# Patient Record
Sex: Male | Born: 1970 | Race: White | Hispanic: No | Marital: Single | Smoking: Never smoker
Health system: Southern US, Community
[De-identification: ages and names within clinical notes are randomized; demographics above are authoritative.]

---

## 2013-03-13 ENCOUNTER — Emergency Department: Payer: Self-pay | Admitting: Emergency Medicine

## 2013-03-13 LAB — URINALYSIS, COMPLETE
Bacteria: NONE SEEN
Bilirubin,UR: NEGATIVE
Glucose,UR: 50 mg/dL (ref 0–75)
Leukocyte Esterase: NEGATIVE
Nitrite: NEGATIVE
Ph: 5 (ref 4.5–8.0)
RBC,UR: 3 /HPF (ref 0–5)
Specific Gravity: 1.028 (ref 1.003–1.030)
Squamous Epithelial: <1
WBC UR: 5 /HPF (ref 0–5)

## 2013-03-13 LAB — COMPREHENSIVE METABOLIC PANEL
Albumin: 4.3 g/dL (ref 3.4–5.0)
Bilirubin,Total: 0.5 mg/dL (ref 0.2–1.0)
Calcium, Total: 9.5 mg/dL (ref 8.5–10.1)
Chloride: 109 mmol/L — ABNORMAL HIGH (ref 98–107)
Co2: 27 mmol/L (ref 21–32)
Creatinine: 0.87 mg/dL (ref 0.60–1.30)
EGFR (African American): >60
EGFR (Non-African Amer.): >60
Glucose: 171 mg/dL — ABNORMAL HIGH (ref 65–99)
Osmolality: 286 (ref 275–301)
Potassium: 4 mmol/L (ref 3.5–5.1)
SGPT (ALT): 28 U/L (ref 12–78)
Total Protein: 7.3 g/dL (ref 6.4–8.2)

## 2013-03-13 LAB — CBC
HCT: 46.2 % (ref 40.0–52.0)
RDW: 14 % (ref 11.5–14.5)
WBC: 13.5 10*3/uL — ABNORMAL HIGH (ref 3.8–10.6)

## 2015-11-05 ENCOUNTER — Encounter: Payer: Self-pay | Admitting: *Deleted

## 2015-11-05 ENCOUNTER — Emergency Department
Admission: EM | Admit: 2015-11-05 | Discharge: 2015-11-05 | Disposition: A | Discharge status: Home / Self Care | Payer: Managed Care, Other (non HMO) | Attending: Emergency Medicine | Admitting: Emergency Medicine

## 2015-11-05 ENCOUNTER — Emergency Department: Payer: Managed Care, Other (non HMO)

## 2015-11-05 DIAGNOSIS — R1031 Right lower quadrant pain: Secondary | ICD-10-CM | POA: Diagnosis present

## 2015-11-05 DIAGNOSIS — N200 Calculus of kidney: Secondary | ICD-10-CM | POA: Diagnosis not present

## 2015-11-05 DIAGNOSIS — R109 Unspecified abdominal pain: Secondary | ICD-10-CM

## 2015-11-05 LAB — URINALYSIS COMPLETE WITH MICROSCOPIC (ARMC ONLY)
Bacteria, UA: NONE SEEN
Bilirubin Urine: NEGATIVE
Glucose, UA: NEGATIVE mg/dL
Leukocytes, UA: NEGATIVE
Nitrite: NEGATIVE
Protein, ur: NEGATIVE mg/dL
Specific Gravity, Urine: 1.012 (ref 1.005–1.030)
pH: 6 (ref 5.0–8.0)

## 2015-11-05 MED ORDER — ONDANSETRON HCL 4 MG/2ML IJ SOLN
4.0000 mg | Freq: Once | INTRAMUSCULAR | Status: AC
  Administered 2015-11-05: 4 mg via INTRAVENOUS
  Filled 2015-11-05: qty 2

## 2015-11-05 MED ORDER — ONDANSETRON HCL 4 MG PO TABS: 4.0000 mg | ORAL_TABLET | Freq: Three times a day (TID) | ORAL | 0 refills | Status: AC | PRN

## 2015-11-05 MED ORDER — SODIUM CHLORIDE 0.9 % IV BOLUS (SEPSIS)
500.0000 mL | Freq: Once | INTRAVENOUS | Status: AC
  Administered 2015-11-05: 500 mL via INTRAVENOUS

## 2015-11-05 MED ORDER — IBUPROFEN 400 MG PO TABS: 400.0000 mg | ORAL_TABLET | Freq: Four times a day (QID) | ORAL | 0 refills | Status: AC | PRN

## 2015-11-05 MED ORDER — HYDROMORPHONE HCL 1 MG/ML IJ SOLN
1.0000 mg | Freq: Once | INTRAMUSCULAR | Status: AC
  Administered 2015-11-05: 1 mg via INTRAVENOUS
  Filled 2015-11-05: qty 1

## 2015-11-05 MED ORDER — KETOROLAC TROMETHAMINE 30 MG/ML IJ SOLN
30.0000 mg | Freq: Once | INTRAMUSCULAR | Status: AC
  Administered 2015-11-05: 30 mg via INTRAVENOUS
  Filled 2015-11-05: qty 1

## 2015-11-05 MED ORDER — HYDROMORPHONE HCL 2 MG PO TABS: 2.0000 mg | ORAL_TABLET | Freq: Two times a day (BID) | ORAL | 0 refills | Status: AC | PRN

## 2015-11-05 NOTE — ED Provider Notes (Signed)
Time Seen: Approximately 0852 I have reviewed the triage notes  Chief Complaint: Flank Pain   History of Present Illness: Bryan Chang is a 45 y.o. male *patient states he had onset of some right-sided flank and abdominal pain and started approximately 2 hours prior to arrival. He has a previous history of renal colic and states pain seems very similar. He is not aware of any objective fever at home though he states he did get sweaty with the discomfort. He states pain is gone progressively worse. He denies any groin or testicular pain. He denies any hematuria or dysuria.   History reviewed. No pertinent past medical history.  There are no active problems to display for this patient.   History reviewed. No pertinent surgical history.  History reviewed. No pertinent surgical history.    Allergies:  Review of patient's allergies indicates no known allergies.  Family History: No family history on file.  Social History: Social History  Substance Use Topics  . Smoking status: Never Smoker  . Smokeless tobacco: Never Used  . Alcohol use No     Review of Systems:   10 point review of systems was performed and was otherwise negative:  Constitutional: No fever Eyes: No visual disturbances ENT: No sore throat, ear pain Cardiac: No chest pain Respiratory: No shortness of breath, wheezing, or stridor Abdomen: Pain started right lower quadrant and radiates up toward her right flank area. Nausea with no persistent vomiting Endocrine: No weight loss, No night sweats Extremities: No peripheral edema, cyanosis Skin: No rashes, easy bruising Neurologic: No focal weakness, trouble with speech or swollowing Urologic: No dysuria, Hematuria, or urinary frequency   Physical Exam:  ED Triage Vitals  Enc Vitals Group     BP 11/05/15 0857 (!) 152/99     Pulse Rate 11/05/15 0857 91     Resp --      Temp 11/05/15 0857 98 F (36.7 C)     Temp Source 11/05/15 0857 Oral     SpO2  11/05/15 0857 100 %     Weight 11/05/15 0857 180 lb (81.6 kg)     Height 11/05/15 0857  (1.651 m)     Head Circumference --      Peak Flow --      Pain Score 11/05/15 0847 7     Pain Loc --      Pain Edu? --      Excl. in GC? --     General: Awake , Alert , and Oriented times 3; GCS 15 . Appears uncomfortable Head: Normal cephalic , atraumatic Eyes: Pupils equal , round, reactive to light Nose/Throat: No nasal drainage, patent upper airway without erythema or exudate.  Neck: Supple, Full range of motion, No anterior adenopathy or palpable thyroid masses Lungs: Clear to ascultation without wheezes , rhonchi, or rales Heart: Regular rate, regular rhythm without murmurs , gallops , or rubs Abdomen: Soft, non tender without rebound, guarding , or rigidity; bowel sounds positive and symmetric in all 4 quadrants. No organomegaly .        Extremities: 2 plus symmetric pulses. No edema, clubbing or cyanosis Neurologic: normal ambulation, Motor symmetric without deficits, sensory intact Skin: warm, dry, no rashes   Labs:   All laboratory work was reviewed including any pertinent negatives or positives listed below:  Labs Reviewed  URINALYSIS COMPLETEWITH MICROSCOPIC ([facility] ONLY)  Urinalysis appears normal  Radiology:  CLINICAL DATA:  45 year old male with right flank pain. History kidney stones. Initial  encounter.  EXAM: CT ABDOMEN AND PELVIS WITHOUT CONTRAST  TECHNIQUE: Multidetector CT imaging of the abdomen and pelvis was performed following the standard protocol without IV contrast.  COMPARISON:  03/13/2013 CT  FINDINGS: Lower chest:  Clear lung bases.  Hepatobiliary: Taking into account limitation by non contrast imaging, no worrisome hepatic lesion.  Pancreas: Taking into account limitation by non contrast imaging, no worrisome pancreatic lesion or inflammation.  Spleen: Taking into account limitation by non contrast imaging, no worrisome splenic lesion  or enlargement.  Adrenals/Urinary Tract: Moderate right-sided hydroureteronephrosis secondary to tiny (1 mm) right ureteral vesicle junction stone.  Taking into account limitation by non contrast imaging, no renal or adrenal mass.  Stomach/Bowel: Scattered diverticula most notable descending colon without evidence of extra luminal bowel inflammatory process, free fluid or free air.  Vascular/Lymphatic: No aortic aneurysm or adenopathy.  Reproductive: Negative.  Other: Negative.  Musculoskeletal: Degenerative changes lower thoracic lumbar spine without bony destructive lesion.  IMPRESSION: Moderate right-sided hydroureteronephrosis secondary to tiny (1 mm) right ureteral vesicle junction stone.  Scattered diverticula most notable descending colon without evidence of extra luminal bowel inflammatory process, free fluid or free air. Specifically, no inflammation surrounds the appendix.   Electronically Signed   By: Lacy DuverneySteven  Olson M.D.   On: 11/05/2015 10:02    I personally reviewed the radiologic studies    ED Course:  Patient's stay here was uneventful. Symptomatic relief after IV Toradol, IV Dilaudid, and IV Zofran. He states no discomfort at this point. His differential is somewhat limited to renal colic, acute appendicitis, urinary tract infection, etc. Given his current clinical presentation and objective findings I felt most likely this was acute renal colic. \   Clinical Course     Assessment:  Renal colic   Final Clinical Impression:   Final diagnoses:  Right sided abdominal pain     Plan:  Outpatient management Follow-up urology Patient was advised to return immediately if condition worsens. Patient was advised to follow up with their primary care physician or other specialized physicians involved in their outpatient care. The patient and/or family member/power of attorney had laboratory results reviewed at the bedside. All questions and  concerns were addressed and appropriate discharge instructions were distributed by the nursing staff.             Jennye MoccasinBrian S Saira Kramme, MD 11/05/15 [phone]

## 2015-11-05 NOTE — Discharge Instructions (Signed)
Please return immediately if condition worsens. Please contact her primary physician or the physician you were given for referral. If you have any specialist physicians involved in her treatment and plan please also contact them. Thank you for using [facility].  Return especially for uncontrolled pain, uncontrolled vomiting, or fever.

## 2015-11-05 NOTE — ED Triage Notes (Signed)
Pt reports having a history of kidney stones, pt is having right flank pain starting today, pt denies fever

## 2017-10-17 IMAGING — CT CT RENAL STONE PROTOCOL
2 of 4 series · 16 of 46 positions shown, 18 images · non-contrast
Comparison: 03/13/2013 CT

CLINICAL DATA: 45-year-old male with right flank pain. History
kidney stones. Initial encounter.

EXAM:
CT ABDOMEN AND PELVIS WITHOUT CONTRAST
TECHNIQUE: Multidetector CT imaging of the abdomen and pelvis was performed
following the standard protocol without IV contrast.

[Series 2: axial st · axial · 0.82mm/px · z∈[-482,-52]mm · 13 of 96 slices shown, 15 images]
[im 5/96  soft-tissue]
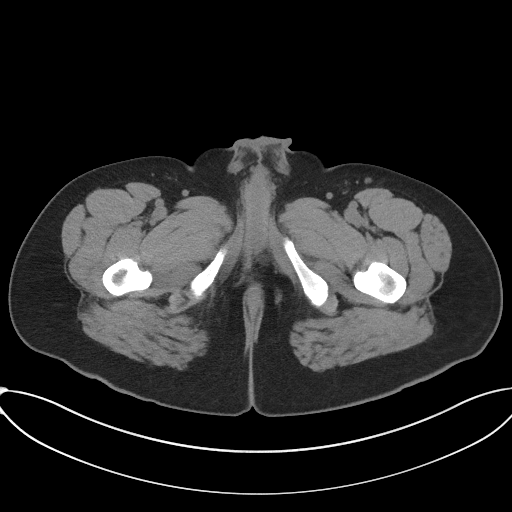
[im 5/96  bone]
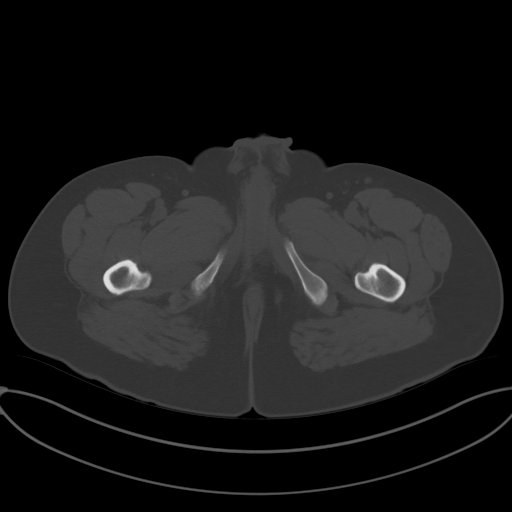
[im 13/96  soft-tissue]
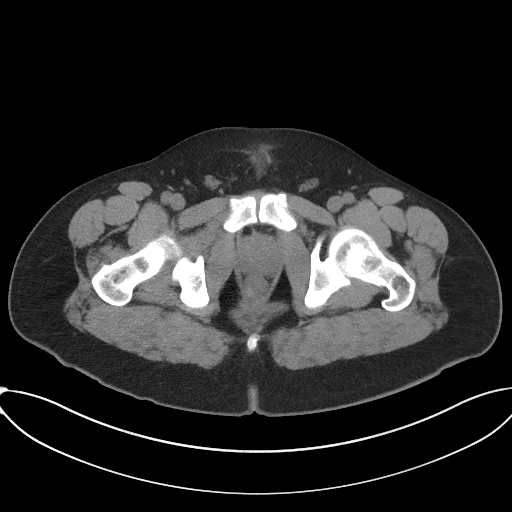
[im 21/96  soft-tissue]
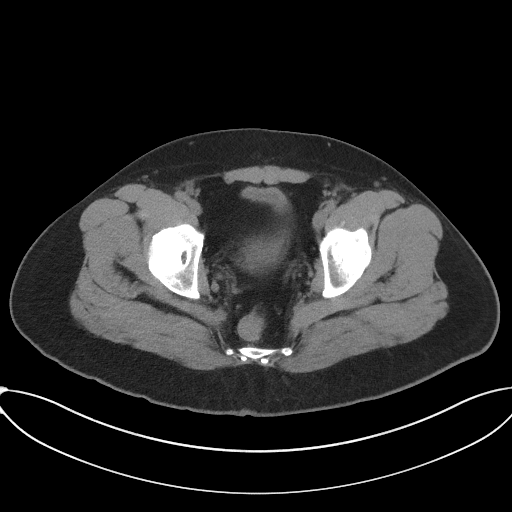
[im 25/96  soft-tissue]
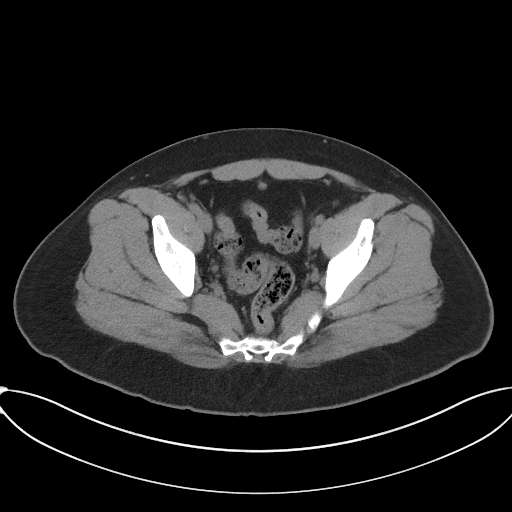
[im 34/96  soft-tissue]
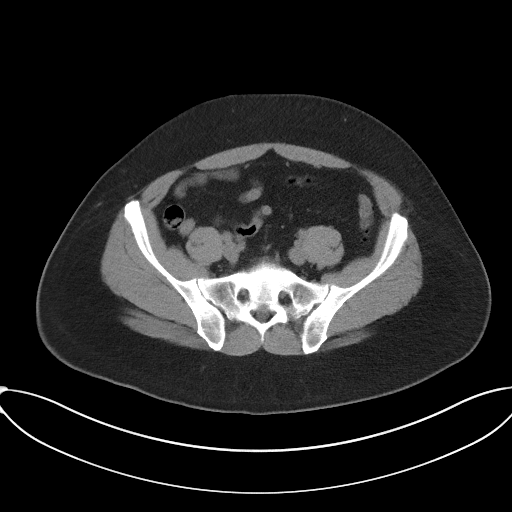
[im 42/96  soft-tissue]
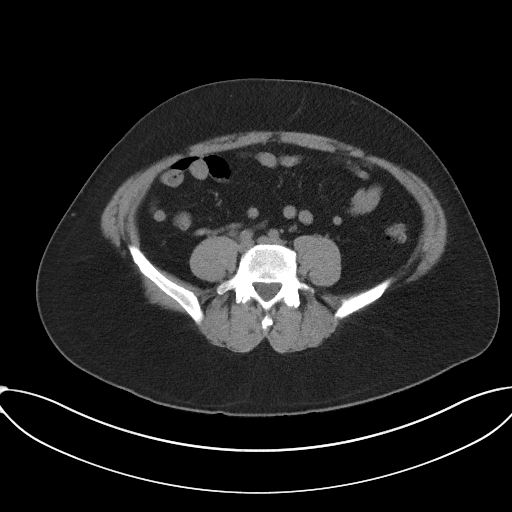
[im 50/96  soft-tissue]
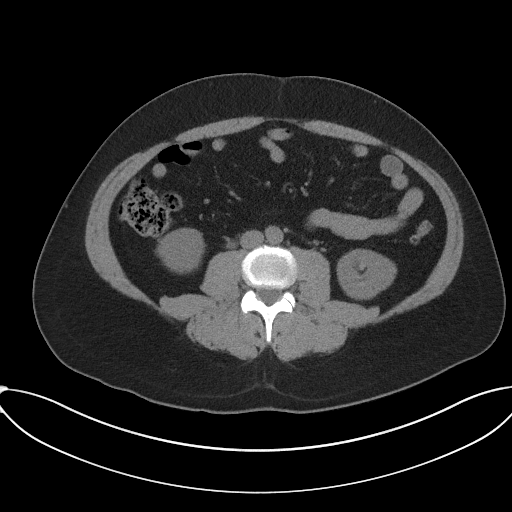
[im 54/96  soft-tissue]
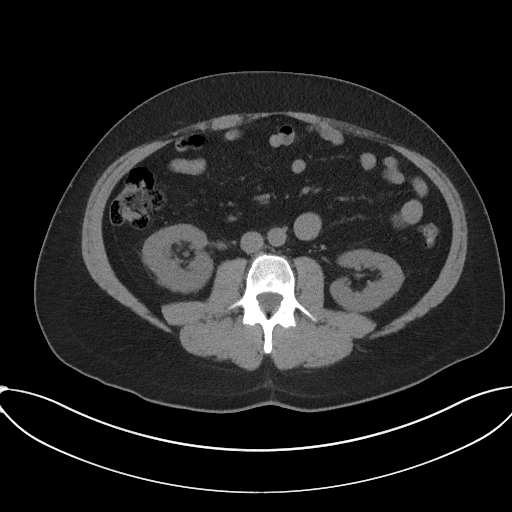
[im 62/96  soft-tissue]
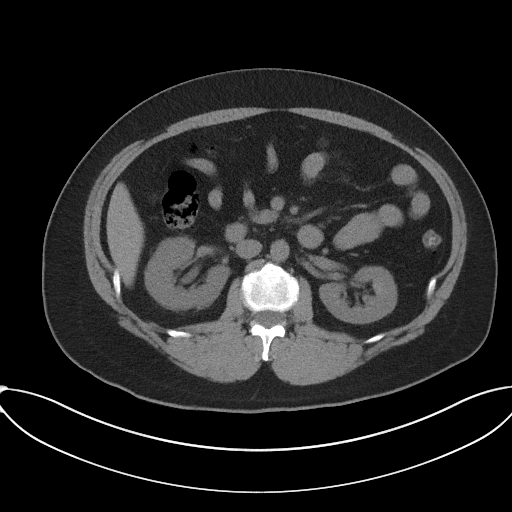
[im 62/96  bone]
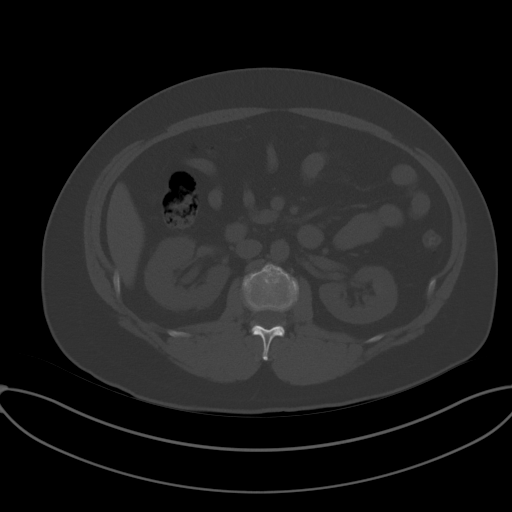
[im 71/96  soft-tissue]
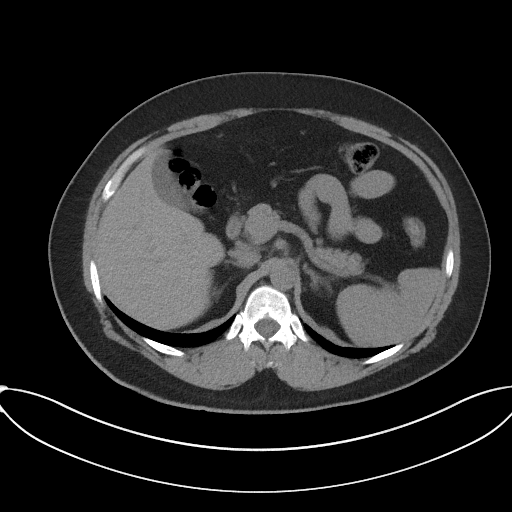
[im 75/96  soft-tissue]
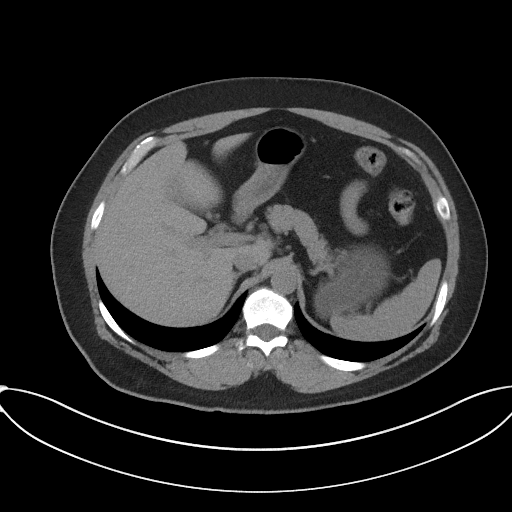
[im 83/96  soft-tissue]
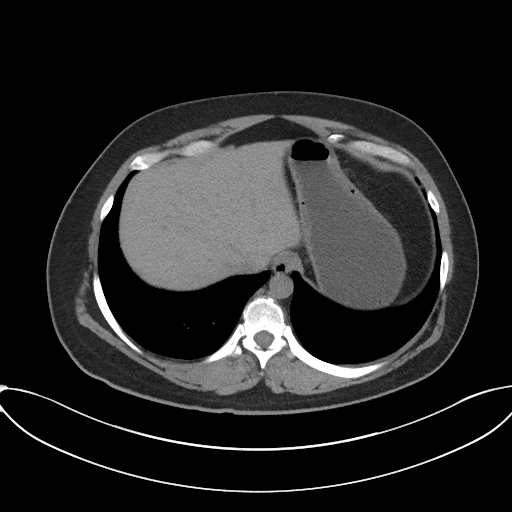
[im 91/96  soft-tissue]
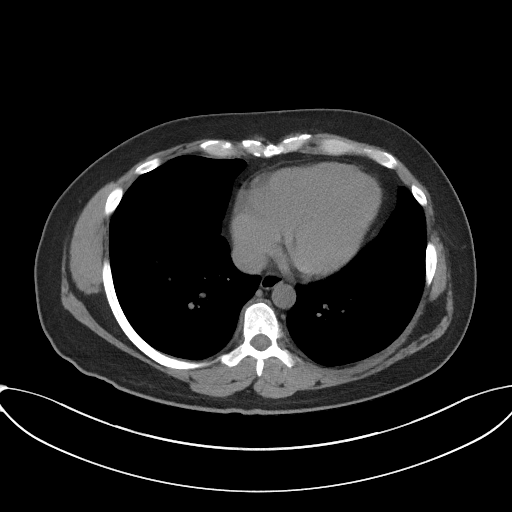

[Series 4: coronal st · coronal · 0.83mm/px · 3 of 85 slices shown]
[im 29/85  soft-tissue]
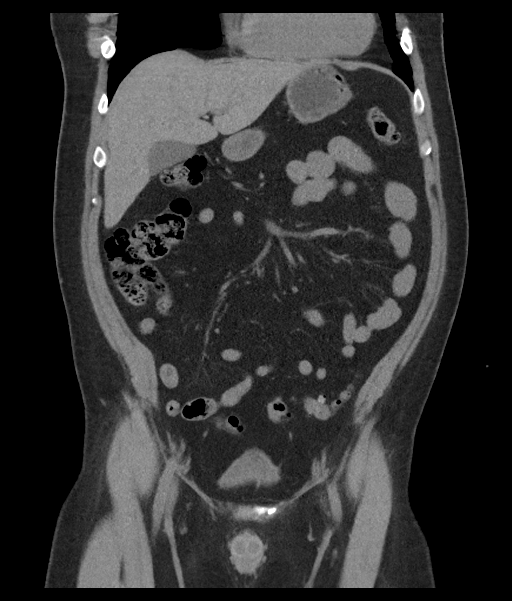
[im 38/85  soft-tissue]
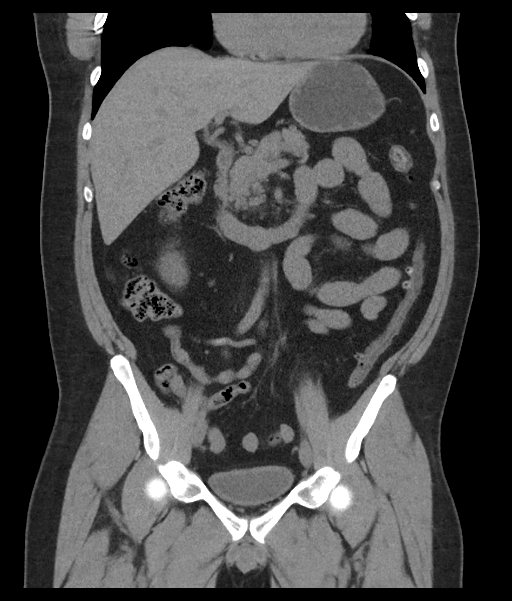
[im 47/85  soft-tissue]
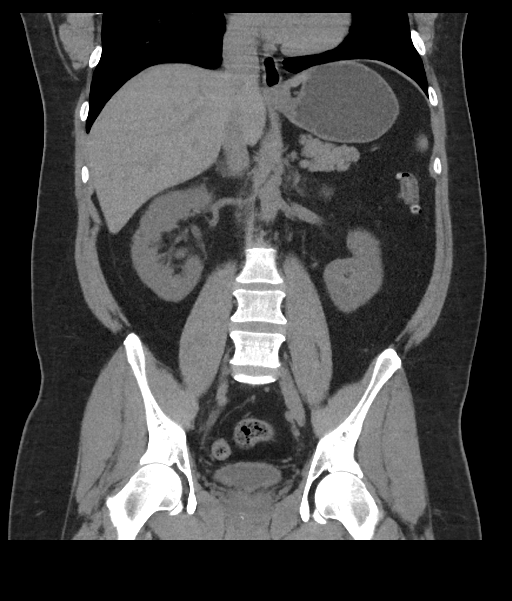

[16 of 46 positions shown; findings below may reference images not displayed]

FINDINGS: Lower chest:  Clear lung bases.

Hepatobiliary: Taking into account limitation by non contrast
imaging, no worrisome hepatic lesion.

Pancreas: Taking into account limitation by non contrast imaging, no
worrisome pancreatic lesion or inflammation.

Spleen: Taking into account limitation by non contrast imaging, no
worrisome splenic lesion or enlargement.

Adrenals/Urinary Tract: Moderate right-sided hydroureteronephrosis
secondary to tiny (1 mm) right ureteral vesicle junction stone.

Taking into account limitation by non contrast imaging, no renal or
adrenal mass.

Stomach/Bowel: Scattered diverticula most notable descending colon
without evidence of extra luminal bowel inflammatory process, free
fluid or free air.

Vascular/Lymphatic: No aortic aneurysm or adenopathy.

Reproductive: Negative.

Other: Negative.

Musculoskeletal: Degenerative changes lower thoracic lumbar spine
without bony destructive lesion.
IMPRESSION: Moderate right-sided hydroureteronephrosis secondary to tiny (1 mm)
right ureteral vesicle junction stone.

Scattered diverticula most notable descending colon without evidence
of extra luminal bowel inflammatory process, free fluid or free air.
Specifically, no inflammation surrounds the appendix.

## 2019-09-06 ENCOUNTER — Telehealth: Payer: Self-pay | Admitting: Family Medicine

## 2019-09-06 NOTE — Telephone Encounter (Signed)
Bryan Chang called in and stated that his mother said that Dr.Scott was going to take him as a new patient.

## 2019-09-07 NOTE — Telephone Encounter (Signed)
Ok

## 2019-09-07 NOTE — Telephone Encounter (Signed)
Just wanted to confirm ok with you before sending to [place] to schedule. We discussed this earlier this AM

## 2019-09-10 NOTE — Telephone Encounter (Signed)
LMTCB and schedule New pt appt

## 2020-02-09 ENCOUNTER — Emergency Department
Admission: EM | Admit: 2020-02-09 | Discharge: 2020-02-09 | Disposition: A | Discharge status: Home / Self Care | Payer: Managed Care, Other (non HMO) | Attending: Emergency Medicine | Admitting: Emergency Medicine

## 2020-02-09 ENCOUNTER — Other Ambulatory Visit: Payer: Self-pay

## 2020-02-09 ENCOUNTER — Encounter: Payer: Self-pay | Admitting: Emergency Medicine

## 2020-02-09 DIAGNOSIS — Z2914 Encounter for prophylactic rabies immune globin: Secondary | ICD-10-CM | POA: Diagnosis not present

## 2020-02-09 DIAGNOSIS — S61451A Open bite of right hand, initial encounter: Secondary | ICD-10-CM | POA: Diagnosis not present

## 2020-02-09 DIAGNOSIS — W540XXA Bitten by dog, initial encounter: Secondary | ICD-10-CM | POA: Diagnosis not present

## 2020-02-09 DIAGNOSIS — Y9301 Activity, walking, marching and hiking: Secondary | ICD-10-CM | POA: Diagnosis not present

## 2020-02-09 DIAGNOSIS — Z23 Encounter for immunization: Secondary | ICD-10-CM | POA: Insufficient documentation

## 2020-02-09 DIAGNOSIS — S81852A Open bite, left lower leg, initial encounter: Secondary | ICD-10-CM | POA: Diagnosis not present

## 2020-02-09 DIAGNOSIS — Y9389 Activity, other specified: Secondary | ICD-10-CM | POA: Insufficient documentation

## 2020-02-09 MED ORDER — RABIES VACCINE, PCEC IM SUSR
1.0000 mL | Freq: Once | INTRAMUSCULAR | Status: AC
  Administered 2020-02-09: 1 mL via INTRAMUSCULAR
  Filled 2020-02-09: qty 1

## 2020-02-09 MED ORDER — RABIES IMMUNE GLOBULIN 150 UNIT/ML IM INJ
20.0000 [IU]/kg | INJECTION | Freq: Once | INTRAMUSCULAR | Status: AC
  Administered 2020-02-09: 1575 [IU] via INTRAMUSCULAR
  Filled 2020-02-09: qty 10.5

## 2020-02-09 MED ORDER — AMOXICILLIN-POT CLAVULANATE 875-125 MG PO TABS: 1.0000 | ORAL_TABLET | Freq: Two times a day (BID) | ORAL | 0 refills | Status: AC

## 2020-02-09 MED ORDER — TETANUS-DIPHTH-ACELL PERTUSSIS 5-2.5-18.5 LF-MCG/0.5 IM SUSY
0.5000 mL | PREFILLED_SYRINGE | Freq: Once | INTRAMUSCULAR | Status: AC
  Administered 2020-02-09: 0.5 mL via INTRAMUSCULAR
  Filled 2020-02-09: qty 0.5

## 2020-02-09 MED ORDER — DOXYCYCLINE HYCLATE 100 MG PO TABS: 100.0000 mg | ORAL_TABLET | Freq: Two times a day (BID) | ORAL | 0 refills | Status: AC

## 2020-02-09 NOTE — ED Provider Notes (Signed)
[facility] Emergency Department Provider Note  ____________________________________________  Time seen: Approximately 9:37 PM  I have reviewed the triage vital signs and the nursing notes.   HISTORY  Chief Complaint No chief complaint on file.    HPI Bryan Chang is a 49 y.o. male who presents the emergency department after being bit by a dog.  Patient states that he was walking his dog when a neighbor's dog ran out and attacked him.  Patient states that he is aware of who the owner is, but they are unsure whether the animal is up-to-date on its immunizations.  Patient has sustained an injury to the right hand, left knee.  He states that his last tetanus shot was roughly 30 years ago.  Patient has controlled bleeding with direct pressure.  Full range of motion of the right upper extremity left lower extremity.  Patient denies any other injuries or complaints at this time.  No medications prior to arrival.         History reviewed. No pertinent past medical history.  There are no problems to display for this patient.   History reviewed. No pertinent surgical history.  Prior to Admission medications   Medication Sig Start Date End Date Taking? Authorizing Provider  amoxicillin-clavulanate (AUGMENTIN) 875-125 MG tablet Take 1 tablet by mouth 2 (two) times daily. 02/09/20   Karlita Lichtman, Delorise Royals, PA-C  doxycycline (VIBRA-TABS) 100 MG tablet Take 1 tablet (100 mg total) by mouth 2 (two) times daily. 02/09/20   Bonner Larue, Delorise Royals, PA-C  HYDROmorphone (DILAUDID) 2 MG tablet Take 1 tablet (2 mg total) by mouth every 12 (twelve) hours as needed for severe pain. 11/05/15   Jennye Moccasin, MD  ibuprofen (ADVIL,MOTRIN) 400 MG tablet Take 1 tablet (400 mg total) by mouth every 6 (six) hours as needed. 11/05/15   Jennye Moccasin, MD  ondansetron (ZOFRAN) 4 MG tablet Take 1 tablet (4 mg total) by mouth every 8 (eight) hours as needed for nausea or vomiting. 11/05/15    Jennye Moccasin, MD    Allergies Patient has no known allergies.  No family history on file.  Social History Social History   Tobacco Use  . Smoking status: Never Smoker  . Smokeless tobacco: Never Used  Substance Use Topics  . Alcohol use: No  . Drug use: Never     Review of Systems  Constitutional: No fever/chills Eyes: No visual changes. No discharge ENT: No upper respiratory complaints. Cardiovascular: no chest pain. Respiratory: no cough. No SOB. Gastrointestinal: No abdominal pain.  No nausea, no vomiting.  No diarrhea.  No constipation. Musculoskeletal: Dog bite to the right hand, left leg Skin: Negative for rash, abrasions, lacerations, ecchymosis. Neurological: Negative for headaches, focal weakness or numbness.  10 System ROS otherwise negative.  ____________________________________________   PHYSICAL EXAM:  VITAL SIGNS: ED Triage Vitals  Enc Vitals Group     BP 02/09/20 2009 (!) 183/131     Pulse Rate 02/09/20 2009 (!) 113     Resp 02/09/20 2009 18     Temp 02/09/20 2009 99 F (37.2 C)     Temp Source 02/09/20 2009 Oral     SpO2 02/09/20 2009 95 %     Weight 02/09/20 2010 170 lb (77.1 kg)     Height 02/09/20 2010 5\' 5"  (1.651 m)     Head Circumference --      Peak Flow --      Pain Score 02/09/20 2009 6  Pain Loc --      Pain Edu? --      Excl. in GC? --      Constitutional: Alert and oriented. Well appearing and in no acute distress. Eyes: Conjunctivae are normal. PERRL. EOMI. Head: Atraumatic. ENT:      Ears:       Nose: No congestion/rhinnorhea.      Mouth/Throat: Mucous membranes are moist.  Neck: No stridor.    Cardiovascular: Normal rate, regular rhythm. Normal S1 and S2.  Good peripheral circulation. Respiratory: Normal respiratory effort without tachypnea or retractions. Lungs CTAB. Good air entry to the bases with no decreased or absent breath sounds. Musculoskeletal: Full range of motion to all extremities. No gross  deformities appreciated.  Visualization of the right hand reveals a puncture wound to the dorsal aspect between the thumb and index finger.  This occurs in the interdigital space.  No visible foreign body.  Subcutaneous tissue exposed.  No visible foreign body.  Good range of motion to all digits right hand.  Capillary refill and sensation intact all digits.  Visualization of the left knee reveals multiple superficial abrasions consistent with dog bite.  No deep puncture wounds.  No frank lacerations.  No bleeding.  No visible foreign body.  Good underlying motion to the joint with no tenderness over the osseous structures.  Dorsalis pedis pulses sensation intact distally. Neurologic:  Normal speech and language. No gross focal neurologic deficits are appreciated.  Skin:  Skin is warm, dry and intact. No rash noted. Psychiatric: Mood and affect are normal. Speech and behavior are normal. Patient exhibits appropriate insight and judgement.   ____________________________________________   LABS (all labs ordered are listed, but only abnormal results are displayed)  Labs Reviewed - No data to display ____________________________________________  EKG   ____________________________________________  RADIOLOGY   No results found.  ____________________________________________    PROCEDURES  Procedure(s) performed:    Procedures    Medications  Tdap (BOOSTRIX) injection 0.5 mL (0.5 mLs Intramuscular Given 02/09/20 2309)  rabies vaccine (RABAVERT) injection 1 mL (1 mL Intramuscular Given 02/09/20 2256)  rabies immune globulin (HYPERAB/KEDRAB) injection 1,575 Units (1,575 Units Intramuscular Given 02/09/20 2258)     ____________________________________________   INITIAL IMPRESSION / ASSESSMENT AND PLAN / ED COURSE  Pertinent labs & imaging results that were available during my care of the patient were reviewed by me and considered in my medical decision making (see chart for  details).  Review of the [place] CSRS was performed in accordance of the [facility] prior to dispensing any controlled drugs.           Patient's diagnosis is consistent with dog bite.  Patient presented to emergency department after being attacked and bitten by a neighborhood dog.  Unsure of the animals shot status.  This was an unprovoked attack.  At this time patient will have a tetanus shot, initiate the rabies series, place the patient on antibiotics.  Given the multiple soft tissue wounds, as well as one on the hand I will double cover the patient with both Augmentin and doxycycline.  Return in 3 days for second rabies shot.  Patient is instructed he may go to [facility] urgent care, [place] urgent care or if necessary return to the emergency department..  Wounds are not closed but are thoroughly cleansed.  Patient has Steri-Strips for stabilization of the puncture wound but approximation of the edges is not undertaken.  Wound care instructions discussed with the patient.  Patient is given ED precautions  to return to the ED for any worsening or new symptoms.     ____________________________________________  FINAL CLINICAL IMPRESSION(S) / ED DIAGNOSES  Final diagnoses:  Dog bite, initial encounter      NEW MEDICATIONS STARTED DURING THIS VISIT:  ED Discharge Orders         Ordered    amoxicillin-clavulanate (AUGMENTIN) 875-125 MG tablet  2 times daily        02/09/20 2325    doxycycline (VIBRA-TABS) 100 MG tablet  2 times daily        02/09/20 2325              This chart was dictated using voice recognition software/Dragon. Despite best efforts to proofread, errors can occur which can change the meaning. Any change was purely unintentional.    Racheal Patches, PA-C 02/09/20 2340    Arnaldo Natal, MD 02/10/20 [phone]

## 2020-02-09 NOTE — ED Triage Notes (Addendum)
Patient states that he was bit by a neighbors dog. Patient with bites to left hand, left upper arm and right leg with bleeding controlled. Patient states that he is unsure if the dog has it's vaccinations.

## 2020-02-09 NOTE — ED Notes (Signed)
[employer] officer here to talk to patient.

## 2020-02-09 NOTE — ED Notes (Signed)
Wound on hand bandaged by PA-C. Will defer to his assessment. Patient is calm and cooperative.

## 2020-02-12 ENCOUNTER — Ambulatory Visit
Admission: EM | Admit: 2020-02-12 | Discharge: 2020-02-12 | Disposition: A | Discharge status: Home / Self Care | Payer: Managed Care, Other (non HMO) | Attending: Emergency Medicine | Admitting: Emergency Medicine

## 2020-02-12 DIAGNOSIS — Z23 Encounter for immunization: Secondary | ICD-10-CM | POA: Diagnosis not present

## 2020-02-12 MED ORDER — RABIES VACCINE, PCEC IM SUSR
1.0000 mL | Freq: Once | INTRAMUSCULAR | Status: AC
  Administered 2020-02-12: 1 mL via INTRAMUSCULAR

## 2020-02-12 NOTE — ED Triage Notes (Signed)
Pt here to receive 2nd rabies vaccine.  

## 2020-02-12 NOTE — Discharge Instructions (Signed)
Return on 11/20 for 3rd rabies vaccine. Return on 11/27 for 4th rabies vaccine.

## 2020-02-16 ENCOUNTER — Other Ambulatory Visit: Payer: Self-pay

## 2020-02-16 ENCOUNTER — Ambulatory Visit
Admission: EM | Admit: 2020-02-16 | Discharge: 2020-02-16 | Disposition: A | Discharge status: Home / Self Care | Payer: Managed Care, Other (non HMO) | Attending: Family Medicine | Admitting: Family Medicine

## 2020-02-16 DIAGNOSIS — Z203 Contact with and (suspected) exposure to rabies: Secondary | ICD-10-CM | POA: Diagnosis not present

## 2020-02-16 DIAGNOSIS — Z23 Encounter for immunization: Secondary | ICD-10-CM

## 2020-02-16 MED ORDER — RABIES VACCINE, PCEC IM SUSR
1.0000 mL | Freq: Once | INTRAMUSCULAR | Status: AC
  Administered 2020-02-16: 1 mL via INTRAMUSCULAR

## 2020-02-16 NOTE — ED Triage Notes (Signed)
Patient is here for Day 7 Rabies vaccine. Has tolerated other injections well. Advised to return in 1 week (next Saturday) for Day 14 Vaccine.

## 2020-02-16 NOTE — Discharge Instructions (Signed)
Follow up here on 02/23/2020 for Day 14 of the Rabies Vaccine.

## 2020-02-23 ENCOUNTER — Ambulatory Visit
Admission: EM | Admit: 2020-02-23 | Discharge: 2020-02-23 | Disposition: A | Discharge status: Home / Self Care | Payer: Managed Care, Other (non HMO) | Attending: Internal Medicine | Admitting: Internal Medicine

## 2020-02-23 ENCOUNTER — Other Ambulatory Visit: Payer: Self-pay

## 2020-02-23 DIAGNOSIS — Z203 Contact with and (suspected) exposure to rabies: Secondary | ICD-10-CM | POA: Diagnosis not present

## 2020-02-23 MED ORDER — RABIES VACCINE, PCEC IM SUSR
1.0000 mL | Freq: Once | INTRAMUSCULAR | Status: AC
  Administered 2020-02-23: 1 mL via INTRAMUSCULAR

## 2020-02-23 NOTE — ED Triage Notes (Signed)
Pt presents for final rabies vaccine. He denies any changes.
